# Patient Record
Sex: Female | Born: 1965 | Race: White | Hispanic: No | Marital: Married | Smoking: Never smoker
Health system: Southern US, Community
[De-identification: ages and names within clinical notes are randomized; demographics above are authoritative.]

## PROBLEM LIST (undated history)

## (undated) DIAGNOSIS — J309 Allergic rhinitis, unspecified: Secondary | ICD-10-CM

## (undated) DIAGNOSIS — E7800 Pure hypercholesterolemia, unspecified: Secondary | ICD-10-CM

## (undated) DIAGNOSIS — F419 Anxiety disorder, unspecified: Secondary | ICD-10-CM

## (undated) DIAGNOSIS — D72819 Decreased white blood cell count, unspecified: Secondary | ICD-10-CM

## (undated) DIAGNOSIS — E559 Vitamin D deficiency, unspecified: Secondary | ICD-10-CM

## (undated) HISTORY — DX: Pure hypercholesterolemia, unspecified: E78.00

## (undated) HISTORY — DX: Allergic rhinitis, unspecified: J30.9

## (undated) HISTORY — DX: Decreased white blood cell count, unspecified: D72.819

## (undated) HISTORY — DX: Vitamin D deficiency, unspecified: E55.9

## (undated) HISTORY — DX: Anxiety disorder, unspecified: F41.9

## (undated) HISTORY — PX: BREAST BIOPSY: SHX20

---

## 1998-09-01 ENCOUNTER — Other Ambulatory Visit: Admission: RE | Admit: 1998-09-01 | Discharge: 1998-09-01 | Payer: Self-pay | Admitting: Obstetrics & Gynecology

## 1999-09-08 ENCOUNTER — Other Ambulatory Visit: Admission: RE | Admit: 1999-09-08 | Discharge: 1999-09-08 | Payer: Self-pay | Admitting: Obstetrics and Gynecology

## 2000-09-24 ENCOUNTER — Other Ambulatory Visit: Admission: RE | Admit: 2000-09-24 | Discharge: 2000-09-24 | Payer: Self-pay | Admitting: Obstetrics and Gynecology

## 2001-10-31 ENCOUNTER — Other Ambulatory Visit: Admission: RE | Admit: 2001-10-31 | Discharge: 2001-10-31 | Payer: Self-pay | Admitting: Obstetrics and Gynecology

## 2002-11-03 ENCOUNTER — Other Ambulatory Visit: Admission: RE | Admit: 2002-11-03 | Discharge: 2002-11-03 | Payer: Self-pay | Admitting: Obstetrics and Gynecology

## 2003-01-02 ENCOUNTER — Ambulatory Visit (HOSPITAL_COMMUNITY): Admission: RE | Admit: 2003-01-02 | Discharge: 2003-01-02 | Payer: Self-pay | Admitting: Obstetrics and Gynecology

## 2003-01-02 ENCOUNTER — Encounter (INDEPENDENT_AMBULATORY_CARE_PROVIDER_SITE_OTHER): Payer: Self-pay

## 2004-01-05 ENCOUNTER — Other Ambulatory Visit: Admission: RE | Admit: 2004-01-05 | Discharge: 2004-01-05 | Payer: Self-pay | Admitting: Obstetrics and Gynecology

## 2005-03-15 ENCOUNTER — Other Ambulatory Visit: Admission: RE | Admit: 2005-03-15 | Discharge: 2005-03-15 | Payer: Self-pay | Admitting: Obstetrics and Gynecology

## 2006-06-25 ENCOUNTER — Encounter: Admission: RE | Admit: 2006-06-25 | Discharge: 2006-06-25 | Payer: Self-pay | Admitting: Family Medicine

## 2006-06-28 ENCOUNTER — Encounter: Admission: RE | Admit: 2006-06-28 | Discharge: 2006-06-28 | Payer: Self-pay | Admitting: Family Medicine

## 2006-10-17 ENCOUNTER — Other Ambulatory Visit: Admission: RE | Admit: 2006-10-17 | Discharge: 2006-10-17 | Payer: Self-pay | Admitting: Family Medicine

## 2007-01-28 ENCOUNTER — Encounter: Admission: RE | Admit: 2007-01-28 | Discharge: 2007-01-28 | Payer: Self-pay | Admitting: Family Medicine

## 2007-07-15 ENCOUNTER — Encounter: Admission: RE | Admit: 2007-07-15 | Discharge: 2007-07-15 | Payer: Self-pay | Admitting: Family Medicine

## 2007-11-01 ENCOUNTER — Other Ambulatory Visit: Admission: RE | Admit: 2007-11-01 | Discharge: 2007-11-01 | Payer: Self-pay | Admitting: Family Medicine

## 2008-02-18 ENCOUNTER — Encounter: Admission: RE | Admit: 2008-02-18 | Discharge: 2008-02-18 | Payer: Self-pay | Admitting: Family Medicine

## 2008-09-18 ENCOUNTER — Encounter: Admission: RE | Admit: 2008-09-18 | Discharge: 2008-09-18 | Payer: Self-pay | Admitting: Family Medicine

## 2009-01-28 ENCOUNTER — Other Ambulatory Visit: Admission: RE | Admit: 2009-01-28 | Discharge: 2009-01-28 | Payer: Self-pay | Admitting: Family Medicine

## 2009-10-27 ENCOUNTER — Encounter: Admission: RE | Admit: 2009-10-27 | Discharge: 2009-10-27 | Payer: Self-pay | Admitting: Family Medicine

## 2010-04-24 ENCOUNTER — Encounter: Payer: Self-pay | Admitting: Family Medicine

## 2010-08-19 NOTE — H&P (Signed)
   NAMELEJLA, MOESER                        ACCOUNT NO.:  [id number]   MEDICAL RECORD NO.:  [id number]                   PATIENT TYPE:  AMB   LOCATION:  SDC                                  FACILITY:  [facility]   PHYSICIAN:  Dineen Kid. Rana Snare, M.D.                 DATE OF BIRTH:  1965/06/02   DATE OF ADMISSION:  01/02/2003  DATE OF DISCHARGE:                                HISTORY & PHYSICAL   HISTORY OF PRESENT ILLNESS:  Beth Dunn is a 45 year old, G2, P2, with  worsening abnormal bleeding, mostly in the mid cycle.  Her husband has had a  vasectomy.  She has not been on birth control pill.  She thought this was  previously due to cervicitis that did not respond to antibiotics.  She  underwent a sonohysterogram for evaluation of the abnormal bleeding.  She  has a 1.1 cm x 9 mm polyp within the endometrial cavity coming from the  anterior surface.  She presents today for evaluation and removal of this.   PAST MEDICAL HISTORY:  Significant for anxiety disorder, currently on  Effexor.   PAST OBSTETRICAL HISTORY:  Significant for two vaginal deliveries.   MEDICATIONS:  Effexor 75 mg a day.   ALLERGIES:  She has no known drug allergies.   PHYSICAL EXAMINATION:  VITAL SIGNS:  Her blood pressure is 124/68.  HEART:  Regular rate and rhythm.  LUNGS:  Clear to auscultation bilaterally.  ABDOMEN:  Nondistended and nontender.  PELVIC:  The uterus is 3.59 x 3.3 x 4.58 cm with a 1.1 cm polypoid mass.   IMPRESSION AND PLAN:  Abnormal bleeding and endometrial mass consistent with  a polyp.  Recommend hysterectomy and D&C for removal of this.  The risks and  benefits have been discussed at length, which include, but are not limited  to risk of infection, bleeding, damage to uterus, tubes, ovaries, bowel and  bladder, risks associated with anesthesia, and risks associated with blood  transfusion.  She does give her informed consent.                                               Dineen Kid  Rana Snare, M.D.    DCL/MEDQ  D:  01/01/2003  T:  01/01/2003  Job:  [id number]

## 2010-08-19 NOTE — Op Note (Signed)
Beth Dunn, Beth Dunn                        ACCOUNT NO.:  [id number]   MEDICAL RECORD NO.:  [id number]                   PATIENT TYPE:  AMB   LOCATION:  SDC                                  FACILITY:  [facility]   PHYSICIAN:  Dineen Kid. Rana Snare, M.D.                 DATE OF BIRTH:  03/16/1966   DATE OF PROCEDURE:  01/02/2003  DATE OF DISCHARGE:                                 OPERATIVE REPORT   PREOPERATIVE DIAGNOSES:  1. Abnormal uterine bleeding.  2. Endometrial mass consistent with polyp.   POSTOPERATIVE DIAGNOSES:  1. Abnormal uterine bleeding.  2. Endometrial mass consistent with polyp.  3. Friable cervix.   PROCEDURE:  Hysteroscopy D&C.   SURGEON:  Dineen Kid. Rana Snare, M.D.   ANESTHESIA:  Monitored anesthetic care and paracervical block.   ESTIMATED BLOOD LOSS:  Less than 10 mL.   SORBITOL DEFICIT:  Minimal.   FINDINGS:  Small posterior wall endometrial thickening, question polypoid,  otherwise normal-appearing endometrial cavity and normal-appearing left and  right ostia.  She did have a very friable cervix which appeared to be  somewhat polypoid.   DESCRIPTION OF PROCEDURE:  After adequate analgesia, the patient placed in  the dorsal lithotomy position.  She was sterilely prepped and draped.  The  bladder was sterilely drained.  Graves speculum was placed.  Paracervical  block was placed with 1% Xylocaine with 1:100,000 epinephrine, 20 mL used.  Tenaculum was placed in the anterior lip of the cervix.  Uterus was sounded  to 8 cm.  The hysteroscope was inserted.  The above findings were noted.  The large endometrial polyp which was previously seen on sonohistogram was  not identified, however, there was small thickening on the posterior  endometrial wall.  Normal-appearing ostia.  There was also very friable  cervix which could have represented a polypoid mass coming from the anterior  surface.  Sharp curettage was performed, retrieving small fragments from the  posterior  endometrial wall, unable to remove the cervical polypoid area with  curettage.  Reexamination with the hysteroscope revealed no endometrial  pathology at this time.  A small amount of endometrial tissue was sent to  pathology.  Because of the friable cervix and polypoid appearing tissue,  Bovie cautery was used to cauterize the tissue on the external portion of  the cervix with good cauterization noted down to the base of the cervical  polypoid tissue.  The tenaculum was removed and the cervix was hemostatic.  Speculum was then removed and the patient was transferred to the recovery  room in stable condition.  Estimated blood loss during the procedure was  less than 10 mL.  Sorbitol deficit was minimal.    DISPOSITION:  The patient will be discharged to home and will follow up in  the office in two to three weeks.  Sent home with a routine instruction  sheet for D&C and told to return  for increased pain, fever or bleeding.                                               Dineen Kid Rana Snare, M.D.    DCL/MEDQ  D:  01/02/2003  T:  01/02/2003  Job:  [id number]

## 2010-09-20 ENCOUNTER — Other Ambulatory Visit: Payer: Self-pay | Admitting: Family Medicine

## 2010-09-20 DIAGNOSIS — Z1231 Encounter for screening mammogram for malignant neoplasm of breast: Secondary | ICD-10-CM

## 2010-11-10 ENCOUNTER — Ambulatory Visit
Admission: RE | Admit: 2010-11-10 | Discharge: 2010-11-10 | Disposition: A | Discharge status: Home / Self Care | Payer: BC Managed Care – PPO | Source: Ambulatory Visit | Attending: Family Medicine | Admitting: Family Medicine

## 2010-11-10 DIAGNOSIS — Z1231 Encounter for screening mammogram for malignant neoplasm of breast: Secondary | ICD-10-CM

## 2011-10-20 ENCOUNTER — Other Ambulatory Visit: Payer: Self-pay | Admitting: Family Medicine

## 2011-10-20 DIAGNOSIS — Z1231 Encounter for screening mammogram for malignant neoplasm of breast: Secondary | ICD-10-CM

## 2011-11-02 ENCOUNTER — Other Ambulatory Visit: Payer: Self-pay | Admitting: Family Medicine

## 2011-11-02 ENCOUNTER — Other Ambulatory Visit (HOSPITAL_COMMUNITY)
Admission: RE | Admit: 2011-11-02 | Discharge: 2011-11-02 | Disposition: A | Discharge status: Home / Self Care | Payer: BC Managed Care – PPO | Source: Ambulatory Visit | Attending: Family Medicine | Admitting: Family Medicine

## 2011-11-02 DIAGNOSIS — Z124 Encounter for screening for malignant neoplasm of cervix: Secondary | ICD-10-CM | POA: Insufficient documentation

## 2011-11-14 ENCOUNTER — Ambulatory Visit
Admission: RE | Admit: 2011-11-14 | Discharge: 2011-11-14 | Disposition: A | Discharge status: Home / Self Care | Payer: BC Managed Care – PPO | Source: Ambulatory Visit | Attending: Family Medicine | Admitting: Family Medicine

## 2011-11-14 DIAGNOSIS — Z1231 Encounter for screening mammogram for malignant neoplasm of breast: Secondary | ICD-10-CM

## 2011-11-16 ENCOUNTER — Other Ambulatory Visit: Payer: Self-pay | Admitting: Family Medicine

## 2011-11-16 DIAGNOSIS — R928 Other abnormal and inconclusive findings on diagnostic imaging of breast: Secondary | ICD-10-CM

## 2011-11-22 ENCOUNTER — Ambulatory Visit
Admission: RE | Admit: 2011-11-22 | Discharge: 2011-11-22 | Disposition: A | Discharge status: Home / Self Care | Payer: BC Managed Care – PPO | Source: Ambulatory Visit | Attending: Family Medicine | Admitting: Family Medicine

## 2011-11-22 DIAGNOSIS — R928 Other abnormal and inconclusive findings on diagnostic imaging of breast: Secondary | ICD-10-CM

## 2012-10-23 ENCOUNTER — Other Ambulatory Visit: Payer: Self-pay

## 2012-10-23 DIAGNOSIS — Z1231 Encounter for screening mammogram for malignant neoplasm of breast: Secondary | ICD-10-CM

## 2012-11-15 ENCOUNTER — Ambulatory Visit
Admission: RE | Admit: 2012-11-15 | Discharge: 2012-11-15 | Disposition: A | Discharge status: Home / Self Care | Payer: BC Managed Care – PPO | Source: Ambulatory Visit

## 2012-11-15 DIAGNOSIS — Z1231 Encounter for screening mammogram for malignant neoplasm of breast: Secondary | ICD-10-CM

## 2013-10-08 ENCOUNTER — Other Ambulatory Visit: Payer: Self-pay

## 2013-10-08 DIAGNOSIS — Z1231 Encounter for screening mammogram for malignant neoplasm of breast: Secondary | ICD-10-CM

## 2013-11-17 ENCOUNTER — Encounter (INDEPENDENT_AMBULATORY_CARE_PROVIDER_SITE_OTHER): Payer: Self-pay

## 2013-11-17 ENCOUNTER — Ambulatory Visit
Admission: RE | Admit: 2013-11-17 | Discharge: 2013-11-17 | Disposition: A | Discharge status: Home / Self Care | Payer: BC Managed Care – PPO | Source: Ambulatory Visit

## 2013-11-17 DIAGNOSIS — Z1231 Encounter for screening mammogram for malignant neoplasm of breast: Secondary | ICD-10-CM

## 2015-01-04 ENCOUNTER — Other Ambulatory Visit: Payer: Self-pay

## 2015-01-04 DIAGNOSIS — Z1231 Encounter for screening mammogram for malignant neoplasm of breast: Secondary | ICD-10-CM

## 2015-01-12 ENCOUNTER — Ambulatory Visit
Admission: RE | Admit: 2015-01-12 | Discharge: 2015-01-12 | Disposition: A | Discharge status: Home / Self Care | Payer: BC Managed Care – PPO | Source: Ambulatory Visit

## 2015-01-12 DIAGNOSIS — Z1231 Encounter for screening mammogram for malignant neoplasm of breast: Secondary | ICD-10-CM

## 2015-01-15 ENCOUNTER — Other Ambulatory Visit: Payer: Self-pay | Admitting: Family Medicine

## 2015-01-15 DIAGNOSIS — R928 Other abnormal and inconclusive findings on diagnostic imaging of breast: Secondary | ICD-10-CM

## 2015-01-22 ENCOUNTER — Ambulatory Visit
Admission: RE | Admit: 2015-01-22 | Discharge: 2015-01-22 | Disposition: A | Discharge status: Home / Self Care | Payer: BC Managed Care – PPO | Source: Ambulatory Visit | Attending: Family Medicine | Admitting: Family Medicine

## 2015-01-22 DIAGNOSIS — R928 Other abnormal and inconclusive findings on diagnostic imaging of breast: Secondary | ICD-10-CM

## 2015-04-02 ENCOUNTER — Other Ambulatory Visit: Payer: Self-pay | Admitting: Family Medicine

## 2015-04-02 DIAGNOSIS — R52 Pain, unspecified: Secondary | ICD-10-CM

## 2015-04-09 ENCOUNTER — Ambulatory Visit
Admission: RE | Admit: 2015-04-09 | Discharge: 2015-04-09 | Disposition: A | Discharge status: Home / Self Care | Payer: BC Managed Care – PPO | Source: Ambulatory Visit | Attending: Family Medicine | Admitting: Family Medicine

## 2015-04-09 DIAGNOSIS — R52 Pain, unspecified: Secondary | ICD-10-CM

## 2015-05-05 ENCOUNTER — Other Ambulatory Visit: Payer: Self-pay | Admitting: Family Medicine

## 2015-05-05 ENCOUNTER — Other Ambulatory Visit (HOSPITAL_COMMUNITY)
Admission: RE | Admit: 2015-05-05 | Discharge: 2015-05-05 | Disposition: A | Discharge status: Home / Self Care | Payer: BC Managed Care – PPO | Source: Ambulatory Visit | Attending: Family Medicine | Admitting: Family Medicine

## 2015-05-05 DIAGNOSIS — Z01411 Encounter for gynecological examination (general) (routine) with abnormal findings: Secondary | ICD-10-CM | POA: Insufficient documentation

## 2015-05-06 LAB — CYTOLOGY - PAP

## 2015-06-08 ENCOUNTER — Ambulatory Visit (INDEPENDENT_AMBULATORY_CARE_PROVIDER_SITE_OTHER): Payer: BC Managed Care – PPO | Admitting: Licensed Clinical Social Worker

## 2015-06-08 DIAGNOSIS — F419 Anxiety disorder, unspecified: Secondary | ICD-10-CM

## 2015-06-22 ENCOUNTER — Ambulatory Visit (INDEPENDENT_AMBULATORY_CARE_PROVIDER_SITE_OTHER): Payer: BC Managed Care – PPO | Admitting: Licensed Clinical Social Worker

## 2015-06-22 DIAGNOSIS — F419 Anxiety disorder, unspecified: Secondary | ICD-10-CM | POA: Diagnosis not present

## 2015-07-07 ENCOUNTER — Ambulatory Visit: Payer: BC Managed Care – PPO | Admitting: Licensed Clinical Social Worker

## 2015-07-13 ENCOUNTER — Ambulatory Visit (INDEPENDENT_AMBULATORY_CARE_PROVIDER_SITE_OTHER): Payer: BC Managed Care – PPO | Admitting: Licensed Clinical Social Worker

## 2015-07-13 DIAGNOSIS — F419 Anxiety disorder, unspecified: Secondary | ICD-10-CM

## 2015-07-28 ENCOUNTER — Ambulatory Visit (INDEPENDENT_AMBULATORY_CARE_PROVIDER_SITE_OTHER): Payer: BC Managed Care – PPO | Admitting: Licensed Clinical Social Worker

## 2015-07-28 DIAGNOSIS — F419 Anxiety disorder, unspecified: Secondary | ICD-10-CM | POA: Diagnosis not present

## 2015-09-30 ENCOUNTER — Ambulatory Visit (INDEPENDENT_AMBULATORY_CARE_PROVIDER_SITE_OTHER): Payer: BC Managed Care – PPO | Admitting: Licensed Clinical Social Worker

## 2015-09-30 DIAGNOSIS — F419 Anxiety disorder, unspecified: Secondary | ICD-10-CM | POA: Diagnosis not present

## 2015-11-09 ENCOUNTER — Other Ambulatory Visit: Payer: Self-pay | Admitting: Family Medicine

## 2015-11-09 DIAGNOSIS — N6001 Solitary cyst of right breast: Secondary | ICD-10-CM

## 2015-11-10 ENCOUNTER — Ambulatory Visit
Admission: RE | Admit: 2015-11-10 | Discharge: 2015-11-10 | Disposition: A | Discharge status: Home / Self Care | Payer: BC Managed Care – PPO | Source: Ambulatory Visit | Attending: Family Medicine | Admitting: Family Medicine

## 2015-11-10 ENCOUNTER — Other Ambulatory Visit: Payer: Self-pay | Admitting: Family Medicine

## 2015-11-10 DIAGNOSIS — R921 Mammographic calcification found on diagnostic imaging of breast: Secondary | ICD-10-CM

## 2015-11-10 DIAGNOSIS — N6001 Solitary cyst of right breast: Secondary | ICD-10-CM

## 2015-12-27 ENCOUNTER — Other Ambulatory Visit: Payer: Self-pay | Admitting: Family Medicine

## 2015-12-27 DIAGNOSIS — Z1231 Encounter for screening mammogram for malignant neoplasm of breast: Secondary | ICD-10-CM

## 2016-01-17 ENCOUNTER — Ambulatory Visit
Admission: RE | Admit: 2016-01-17 | Discharge: 2016-01-17 | Disposition: A | Discharge status: Home / Self Care | Payer: BC Managed Care – PPO | Source: Ambulatory Visit | Attending: Family Medicine | Admitting: Family Medicine

## 2016-01-17 DIAGNOSIS — Z1231 Encounter for screening mammogram for malignant neoplasm of breast: Secondary | ICD-10-CM

## 2016-02-15 ENCOUNTER — Ambulatory Visit (INDEPENDENT_AMBULATORY_CARE_PROVIDER_SITE_OTHER): Payer: BC Managed Care – PPO | Admitting: Licensed Clinical Social Worker

## 2016-02-15 DIAGNOSIS — F419 Anxiety disorder, unspecified: Secondary | ICD-10-CM

## 2016-03-08 ENCOUNTER — Ambulatory Visit: Payer: BC Managed Care – PPO | Admitting: Licensed Clinical Social Worker

## 2016-09-14 ENCOUNTER — Ambulatory Visit (INDEPENDENT_AMBULATORY_CARE_PROVIDER_SITE_OTHER): Payer: BC Managed Care – PPO | Admitting: Licensed Clinical Social Worker

## 2016-09-14 DIAGNOSIS — F419 Anxiety disorder, unspecified: Secondary | ICD-10-CM | POA: Diagnosis not present

## 2016-12-20 ENCOUNTER — Other Ambulatory Visit: Payer: Self-pay | Admitting: Family Medicine

## 2016-12-20 DIAGNOSIS — Z1231 Encounter for screening mammogram for malignant neoplasm of breast: Secondary | ICD-10-CM

## 2017-01-18 ENCOUNTER — Encounter: Payer: Self-pay | Admitting: Radiology

## 2017-01-18 ENCOUNTER — Ambulatory Visit
Admission: RE | Admit: 2017-01-18 | Discharge: 2017-01-18 | Disposition: A | Discharge status: Home / Self Care | Payer: BC Managed Care – PPO | Source: Ambulatory Visit | Attending: Family Medicine | Admitting: Family Medicine

## 2017-01-18 DIAGNOSIS — Z1231 Encounter for screening mammogram for malignant neoplasm of breast: Secondary | ICD-10-CM

## 2018-03-08 ENCOUNTER — Other Ambulatory Visit: Payer: Self-pay | Admitting: Family Medicine

## 2018-03-08 DIAGNOSIS — Z1231 Encounter for screening mammogram for malignant neoplasm of breast: Secondary | ICD-10-CM

## 2018-04-16 ENCOUNTER — Ambulatory Visit
Admission: RE | Admit: 2018-04-16 | Discharge: 2018-04-16 | Disposition: A | Discharge status: Home / Self Care | Payer: BC Managed Care – PPO | Source: Ambulatory Visit | Attending: Family Medicine | Admitting: Family Medicine

## 2018-04-16 DIAGNOSIS — Z1231 Encounter for screening mammogram for malignant neoplasm of breast: Secondary | ICD-10-CM

## 2018-04-18 ENCOUNTER — Other Ambulatory Visit: Payer: Self-pay | Admitting: *Deleted

## 2018-04-18 DIAGNOSIS — R928 Other abnormal and inconclusive findings on diagnostic imaging of breast: Secondary | ICD-10-CM

## 2018-04-25 ENCOUNTER — Other Ambulatory Visit: Payer: Self-pay | Admitting: Family Medicine

## 2018-04-25 DIAGNOSIS — R928 Other abnormal and inconclusive findings on diagnostic imaging of breast: Secondary | ICD-10-CM

## 2018-04-26 ENCOUNTER — Ambulatory Visit
Admission: RE | Admit: 2018-04-26 | Discharge: 2018-04-26 | Disposition: A | Discharge status: Home / Self Care | Payer: BC Managed Care – PPO | Source: Ambulatory Visit | Attending: *Deleted | Admitting: *Deleted

## 2018-04-26 DIAGNOSIS — R928 Other abnormal and inconclusive findings on diagnostic imaging of breast: Secondary | ICD-10-CM

## 2018-08-14 ENCOUNTER — Ambulatory Visit (INDEPENDENT_AMBULATORY_CARE_PROVIDER_SITE_OTHER): Payer: BC Managed Care – PPO | Admitting: Licensed Clinical Social Worker

## 2018-08-14 DIAGNOSIS — F419 Anxiety disorder, unspecified: Secondary | ICD-10-CM

## 2018-09-11 ENCOUNTER — Ambulatory Visit: Payer: BC Managed Care – PPO | Admitting: Licensed Clinical Social Worker

## 2019-02-18 ENCOUNTER — Other Ambulatory Visit (HOSPITAL_COMMUNITY)
Admission: RE | Admit: 2019-02-18 | Discharge: 2019-02-18 | Disposition: A | Discharge status: Home / Self Care | Payer: BC Managed Care – PPO | Source: Ambulatory Visit | Attending: Family Medicine | Admitting: Family Medicine

## 2019-02-18 ENCOUNTER — Other Ambulatory Visit: Payer: Self-pay | Admitting: Family Medicine

## 2019-02-18 DIAGNOSIS — Z01411 Encounter for gynecological examination (general) (routine) with abnormal findings: Secondary | ICD-10-CM | POA: Insufficient documentation

## 2019-02-20 LAB — CYTOLOGY - PAP
Comment: NEGATIVE
Diagnosis: NEGATIVE
High risk HPV: NEGATIVE

## 2019-04-02 ENCOUNTER — Other Ambulatory Visit: Payer: Self-pay | Admitting: Family Medicine

## 2019-04-02 DIAGNOSIS — Z1231 Encounter for screening mammogram for malignant neoplasm of breast: Secondary | ICD-10-CM

## 2019-05-16 ENCOUNTER — Ambulatory Visit: Payer: BC Managed Care – PPO

## 2019-05-19 ENCOUNTER — Ambulatory Visit: Payer: BC Managed Care – PPO

## 2019-06-23 ENCOUNTER — Other Ambulatory Visit: Payer: Self-pay | Admitting: Family Medicine

## 2019-06-23 DIAGNOSIS — Z1231 Encounter for screening mammogram for malignant neoplasm of breast: Secondary | ICD-10-CM

## 2019-06-30 ENCOUNTER — Other Ambulatory Visit: Payer: Self-pay

## 2019-06-30 ENCOUNTER — Ambulatory Visit
Admission: RE | Admit: 2019-06-30 | Discharge: 2019-06-30 | Disposition: A | Discharge status: Home / Self Care | Payer: BC Managed Care – PPO | Source: Ambulatory Visit

## 2019-06-30 DIAGNOSIS — Z1231 Encounter for screening mammogram for malignant neoplasm of breast: Secondary | ICD-10-CM

## 2020-06-03 ENCOUNTER — Other Ambulatory Visit: Payer: Self-pay | Admitting: Family Medicine

## 2020-06-03 DIAGNOSIS — Z1231 Encounter for screening mammogram for malignant neoplasm of breast: Secondary | ICD-10-CM

## 2020-07-22 ENCOUNTER — Other Ambulatory Visit: Payer: Self-pay

## 2020-07-22 ENCOUNTER — Ambulatory Visit
Admission: RE | Admit: 2020-07-22 | Discharge: 2020-07-22 | Disposition: A | Discharge status: Home / Self Care | Payer: BC Managed Care – PPO | Source: Ambulatory Visit | Attending: Family Medicine | Admitting: Family Medicine

## 2020-07-22 DIAGNOSIS — Z1231 Encounter for screening mammogram for malignant neoplasm of breast: Secondary | ICD-10-CM

## 2021-07-06 ENCOUNTER — Other Ambulatory Visit: Payer: Self-pay | Admitting: Family Medicine

## 2021-07-06 DIAGNOSIS — Z1231 Encounter for screening mammogram for malignant neoplasm of breast: Secondary | ICD-10-CM

## 2021-07-27 ENCOUNTER — Ambulatory Visit: Payer: BC Managed Care – PPO

## 2021-09-06 ENCOUNTER — Ambulatory Visit
Admission: RE | Admit: 2021-09-06 | Discharge: 2021-09-06 | Disposition: A | Discharge status: Home / Self Care | Payer: BC Managed Care – PPO | Source: Ambulatory Visit | Attending: Family Medicine | Admitting: Family Medicine

## 2021-09-06 DIAGNOSIS — Z1231 Encounter for screening mammogram for malignant neoplasm of breast: Secondary | ICD-10-CM

## 2022-04-20 ENCOUNTER — Encounter: Payer: Self-pay | Admitting: Physical Therapy

## 2022-04-20 ENCOUNTER — Other Ambulatory Visit: Payer: Self-pay

## 2022-04-20 ENCOUNTER — Ambulatory Visit: Payer: BC Managed Care – PPO | Attending: Family Medicine | Admitting: Physical Therapy

## 2022-04-20 DIAGNOSIS — M62838 Other muscle spasm: Secondary | ICD-10-CM | POA: Insufficient documentation

## 2022-04-20 DIAGNOSIS — R279 Unspecified lack of coordination: Secondary | ICD-10-CM | POA: Diagnosis not present

## 2022-04-20 NOTE — Therapy (Signed)
OUTPATIENT PHYSICAL THERAPY FEMALE PELVIC EVALUATION   Patient Name: Beth Dunn MRN: [id number] DOB:1965-05-08, 57 y.o., female Today's Date: 04/20/2022  END OF SESSION:  PT End of Session - 04/20/22 1426     Visit Number 1    Date for PT Re-Evaluation 07/13/22    Authorization Type [employer]    PT Start Time 1149    PT Stop Time 1230    PT Time Calculation (min) 41 min    Activity Tolerance Patient tolerated treatment well    Behavior During Therapy [facility] for tasks assessed/performed             History reviewed. No pertinent past medical history. Past Surgical History:  Procedure Laterality Date   BREAST BIOPSY Right    [place]   There are no problems to display for this patient.   PCP: Glenis Smoker, MD   REFERRING PROVIDER: Glenis Smoker, MD   REFERRING DIAG: R35.0 (ICD-10-CM) - Frequency of micturition   THERAPY DIAG:  Unspecified lack of coordination  Other muscle spasm  Rationale for Evaluation and Treatment: Rehabilitation  ONSET DATE: month ago  SUBJECTIVE:                                                                                                                                                                                           SUBJECTIVE STATEMENT: I feel like I am not emptying and having to double void.  I have to go frequently and it is the worst in the morning.  Very small amount of leakage in general.  Coughing sometimes causes leakage.  Full bladder can cause along with something like a cough, but it is a very small amount.  I am going to void every 2 hours other than the morning being worse.  Sometimes in the morning I feel I have to push to empty the bladder Fluid intake: Yes: water but not a lot, mostly coffee, sometimes colas or alcohol    PAIN:  Are you having pain? No  PRECAUTIONS: None  WEIGHT BEARING RESTRICTIONS: No  FALLS:  Has patient fallen in last 6 months? No  LIVING ENVIRONMENT: Lives with: lives  with their spouse Lives in: House/apartment   OCCUPATION: teaching - up and down  PLOF: Independent  PATIENT GOALS: not having to pee as often  PERTINENT HISTORY:  2 vaginal deliveries Sexual abuse: No  BOWEL MOVEMENT: Pain with bowel movement: No Type of bowel movement:Type (Bristol Stool Scale) normal, Frequency normal, and Strain No Fully empty rectum: Yes:   Leakage: No   URINATION: Pain with urination: No Fully empty bladder: No Stream: Weak Urgency: No  Frequency: in the morning every 1 hour; nocturia 1 sometimes Leakage: Coughing and when bladder is full Pads: No  INTERCOURSE: Pain with intercourse: Initial Penetration, During Penetration, and After Intercourse Ability to have vaginal penetration:  No haven't in a while  Marinoff Scale: 3/3  PREGNANCY: Vaginal deliveries 2 Tearing Yes: some with the first   PROLAPSE:    OBJECTIVE:   DIAGNOSTIC FINDINGS:    PATIENT SURVEYS:    PFIQ-7   COGNITION: Overall cognitive status: Within functional limits for tasks assessed     SENSATION: Light touch: Appears intact Proprioception: Appears intact  MUSCLE LENGTH: Hamstrings: Right 90% deg; Left 90% deg Thomas test:  LUMBAR SPECIAL TESTS:    FUNCTIONAL TESTS:    GAIT:  Comments: WFL   POSTURE: anterior pelvic tilt  PELVIC ALIGNMENT:  LUMBARAROM/PROM:  A/PROM A/PROM  eval  Flexion   Extension   Right lateral flexion   Left lateral flexion   Right rotation   Left rotation    (Blank rows = not tested)  LOWER EXTREMITY ROM:  Passive ROM Right eval Left eval  Hip flexion    Hip extension    Hip abduction    Hip adduction    Hip internal rotation    Hip external rotation    Knee flexion    Knee extension    Ankle dorsiflexion    Ankle plantarflexion    Ankle inversion    Ankle eversion     (Blank rows = not tested)  LOWER EXTREMITY MMT: MMT 0-5 scale MMT Right eval Left eval  Hip flexion 5 5  Hip extension 4/5  4/5  Hip abduction 5 5  Hip adduction 5 5  Hip internal rotation 5 5  Hip external rotation 5 5  Knee flexion    Knee extension    Ankle dorsiflexion    Ankle plantarflexion    Ankle inversion    Ankle eversion     PALPATION:   General  tight lumbar and thoracic paraspinals                External Perineal Exam normal appearance, dryness                             Internal Pelvic Floor tender to palpation of obturator Rt>Lt and very tight levators Rt side, but high tone bil, hard time relaxing after tightening the pelvic floor and holds breath while doing a kegel  Patient confirms identification and approves PT to assess internal pelvic floor and treatment Yes  PELVIC MMT:   MMT eval  Vaginal 3+/5 for 4 reps, 10 sec hold   Internal Anal Sphincter   External Anal Sphincter   Puborectalis   Diastasis Recti   (Blank rows = not tested)        TONE: High tone Rt>Lt  PROLAPSE: No   TODAY'S TREATMENT:  DATE: 04/20/22             EVAL and initial self care activities Self care: urge, toileting, moisturizing techniques   PATIENT EDUCATION:  Education details: urge, toileting, moisturizing handouts Person educated: Patient Education method: Explanation, Demonstration, Verbal cues, and Handouts Education comprehension: verbalized understanding  HOME EXERCISE PROGRAM: Not given  ASSESSMENT:  CLINICAL IMPRESSION: Patient is a 57 y.o. female who was seen today for physical therapy evaluation and treatment for urinary frequency. Pt is likely having moderate bladder irritation due to low hydration and muscle tension.  Pt has pelvic floor tension and impaired coordination as she breaths in and holds breath in order to perform kegel. Pt does have strong 3+/5 pelvic floor strength and holding for at least 1 sec.  Pt has spasm of levators and obturators  Rt>Lt.  Pt will benefit from skilled PT to address all areas of impairment for improved voiding frequency and ease of bladder emptying.  OBJECTIVE IMPAIRMENTS: decreased coordination, decreased endurance, decreased ROM, decreased strength, increased muscle spasms, impaired flexibility, and impaired tone.   ACTIVITY LIMITATIONS: continence and toileting  PARTICIPATION LIMITATIONS: interpersonal relationship and community activity  PERSONAL FACTORS: 1 comorbidity: vaginal deliveries  are also affecting patient's functional outcome.   REHAB POTENTIAL: Excellent  CLINICAL DECISION MAKING: Stable/uncomplicated  EVALUATION COMPLEXITY: Low   GOALS: Goals reviewed with patient? Yes  SHORT TERM GOALS: Target date: 05/18/22  Ind with toileting and moisturizing techniques Baseline: Goal status: INITIAL  2.  Ind with initial HEP for stretching and bulging pelvic floor Baseline:  Goal status: INITIAL    LONG TERM GOALS: Target date: 07/13/2022   Pt will be able to hold pelvic floor contraction for 10 seconds or more without breath holding Baseline:  Goal status: INITIAL  2.  Pt will be independent with advanced HEP to maintain improvements made throughout therapy  Baseline:  Goal status: INITIAL  3.  Pt will report she can make it at least 2.5 between voids Baseline:  Goal status: INITIAL  4.  Pt will be able to empty bladder one time without pushing during her morning routine Baseline:  Goal status: INITIAL   PLAN:  PT FREQUENCY: 1x/week  PT DURATION: 12 weeks  PLANNED INTERVENTIONS: Therapeutic exercises, Therapeutic activity, Neuromuscular re-education, Balance training, Gait training, Patient/Family education, Self Care, Joint mobilization, Dry Needling, Electrical stimulation, Cryotherapy, Moist heat, Taping, Biofeedback, Manual therapy, and Re-evaluation  PLAN FOR NEXT SESSION: f/u on toileting and urgency, f/u on drinking more water and moisturizing, discuss  starting with large glass of water in the morning   [employer], PT 04/20/2022, 2:37 PM

## 2022-05-29 ENCOUNTER — Ambulatory Visit: Payer: BC Managed Care – PPO | Attending: Family Medicine | Admitting: Physical Therapy

## 2022-05-29 DIAGNOSIS — R279 Unspecified lack of coordination: Secondary | ICD-10-CM | POA: Diagnosis present

## 2022-05-29 DIAGNOSIS — M62838 Other muscle spasm: Secondary | ICD-10-CM | POA: Diagnosis present

## 2022-05-29 NOTE — Therapy (Signed)
OUTPATIENT PHYSICAL THERAPY FEMALE PELVIC TREATMENT   Patient Name: Beth Dunn MRN: [id number] DOB:[birth year], 57 y.o., female Today's Date: 05/29/2022  END OF SESSION:    No past medical history on file. Past Surgical History:  Procedure Laterality Date   BREAST BIOPSY Right    [place]   There are no problems to display for this patient.   PCP: Glenis Smoker, MD   REFERRING PROVIDER: Glenis Smoker, MD   REFERRING DIAG: R35.0 (ICD-10-CM) - Frequency of micturition   THERAPY DIAG:  No diagnosis found.  Rationale for Evaluation and Treatment: Rehabilitation  ONSET DATE: month ago  SUBJECTIVE:                                                                                                                                                                                           SUBJECTIVE STATEMENT: I have been using moisturizer and not going as often.  Not going at night as much.  As far as the muscles I feel 30% better.  Going twice in the morning is the main issue I have with the frequency.  Fluid intake: Yes: water but not a lot, mostly coffee, sometimes colas or alcohol    PAIN:  Are you having pain? No  PRECAUTIONS: None  WEIGHT BEARING RESTRICTIONS: No  FALLS:  Has patient fallen in last 6 months? No  LIVING ENVIRONMENT: Lives with: lives with their spouse Lives in: House/apartment   OCCUPATION: teaching - up and down  PLOF: Independent  PATIENT GOALS: not having to pee as often  PERTINENT HISTORY:  2 vaginal deliveries Sexual abuse: No  BOWEL MOVEMENT: Pain with bowel movement: No Type of bowel movement:Type (Bristol Stool Scale) normal, Frequency normal, and Strain No Fully empty rectum: Yes:   Leakage: No   URINATION: Pain with urination: No Fully empty bladder: No Stream: Weak Urgency: No Frequency: in the morning every 1 hour; nocturia 1 sometimes Leakage: Coughing and when bladder is full Pads:  No  INTERCOURSE: Pain with intercourse: Initial Penetration, During Penetration, and After Intercourse Ability to have vaginal penetration:  No haven't in a while  Marinoff Scale: 3/3  PREGNANCY: Vaginal deliveries 2 Tearing Yes: some with the first   PROLAPSE:    OBJECTIVE:   DIAGNOSTIC FINDINGS:    PATIENT SURVEYS:    PFIQ-7   COGNITION: Overall cognitive status: Within functional limits for tasks assessed     SENSATION: Light touch: Appears intact Proprioception: Appears intact  MUSCLE LENGTH: Hamstrings: Right 90% deg; Left 90% deg Thomas test:  LUMBAR SPECIAL TESTS:    FUNCTIONAL TESTS:    GAIT:  Comments: [facility]  POSTURE: anterior pelvic tilt  PELVIC ALIGNMENT:  LUMBARAROM/PROM:  A/PROM A/PROM  eval  Flexion   Extension   Right lateral flexion   Left lateral flexion   Right rotation   Left rotation    (Blank rows = not tested)  LOWER EXTREMITY ROM:  Passive ROM Right eval Left eval  Hip flexion    Hip extension    Hip abduction    Hip adduction    Hip internal rotation    Hip external rotation    Knee flexion    Knee extension    Ankle dorsiflexion    Ankle plantarflexion    Ankle inversion    Ankle eversion     (Blank rows = not tested)  LOWER EXTREMITY MMT: MMT 0-5 scale MMT Right eval Left eval  Hip flexion 5 5  Hip extension 4/5 4/5  Hip abduction 5 5  Hip adduction 5 5  Hip internal rotation 5 5  Hip external rotation 5 5  Knee flexion    Knee extension    Ankle dorsiflexion    Ankle plantarflexion    Ankle inversion    Ankle eversion     PALPATION:   General  tight lumbar and thoracic paraspinals                External Perineal Exam normal appearance, dryness                             Internal Pelvic Floor tender to palpation of obturator Rt>Lt and very tight levators Rt side, but high tone bil, hard time relaxing after tightening the pelvic floor and holds breath while doing a kegel  Patient  confirms identification and approves PT to assess internal pelvic floor and treatment Yes  PELVIC MMT:   MMT eval  Vaginal 3+/5 for 4 reps, 10 sec hold   Internal Anal Sphincter   External Anal Sphincter   Puborectalis   Diastasis Recti   (Blank rows = not tested)        TONE: High tone Rt>Lt  PROLAPSE: No   TODAY'S TREATMENT:                                                                                                                              DATE: 05/29/22              NMRE: - Ball squeeze with Kegel  - 3 x daily - 7 x weekly - 1 sets - 10 reps - 3 sec hold - Hooklying Small March  - 1 x daily - 7 x weekly - 2 sets - 10 reps Quadruped - arm reach and kegel with breathing - hard to do everything at the same time  Exercises - Supine Double Knee to Chest  - 1 x daily - 7 x weekly - 1 sets - 3 reps - 30 sec hold - Happy Baby with  Pelvic Floor Lengthening  - 1 x daily - 7 x weekly - 1 sets - 3 reps - 30 hold - Supine Butterfly Groin Stretch  - 1 x daily - 7 x weekly - 1 sets - 3 reps - 30 sec hold - Sidelying Thoracic Lumbar Rotation  - 1 x daily - 7 x weekly - 1 sets - 5 reps - 10 sec hold   PATIENT EDUCATION:  Education details: urge, toileting, moisturizing handouts Person educated: Patient Education method: Explanation, Demonstration, Verbal cues, and Handouts Education comprehension: verbalized understanding  HOME EXERCISE PROGRAM: Access Code: [email or web address] URL: [email or web address] Date: 05/29/2022 Prepared by: Jari Favre  Exercises - Ball squeeze with Kegel  - 3 x daily - 7 x weekly - 1 sets - 10 reps - 3 sec hold - Hooklying Small March  - 1 x daily - 7 x weekly - 2 sets - 10 reps - Supine Double Knee to Chest  - 1 x daily - 7 x weekly - 1 sets - 3 reps - 30 sec hold - Happy Baby with Pelvic Floor Lengthening  - 1 x daily - 7 x weekly - 1 sets - 3 reps - 30 hold - Supine Butterfly Groin Stretch  - 1 x daily - 7 x weekly - 1 sets - 3  reps - 30 sec hold - Sidelying Thoracic Lumbar Rotation  - 1 x daily - 7 x weekly - 1 sets - 5 reps - 10 sec hold  ASSESSMENT:  CLINICAL IMPRESSION: Patient is doing much better with more water and not going as frequently as well as using moisturizer for improved tissue health.  Pt was able to begin working on coordination of pelvic floor and breathing.  Pt did well and exercises added as seen above. Pt will benefit from skilled PT to progress core and postural strength with coordination of pelvic floor.  OBJECTIVE IMPAIRMENTS: decreased coordination, decreased endurance, decreased ROM, decreased strength, increased muscle spasms, impaired flexibility, and impaired tone.   ACTIVITY LIMITATIONS: continence and toileting  PARTICIPATION LIMITATIONS: interpersonal relationship and community activity  PERSONAL FACTORS: 1 comorbidity: vaginal deliveries  are also affecting patient's functional outcome.   REHAB POTENTIAL: Excellent  CLINICAL DECISION MAKING: Stable/uncomplicated  EVALUATION COMPLEXITY: Low   GOALS: Goals reviewed with patient? Yes  SHORT TERM GOALS: Target date: 05/18/22  Ind with toileting and moisturizing techniques Baseline: Goal status: MET  2.  Ind with initial HEP for stretching and bulging pelvic floor Baseline:  Goal status: INITIAL    LONG TERM GOALS: Target date: 07/13/2022   Pt will be able to hold pelvic floor contraction for 10 seconds or more without breath holding Baseline:  Goal status: INITIAL  2.  Pt will be independent with advanced HEP to maintain improvements made throughout therapy  Baseline:  Goal status: INITIAL  3.  Pt will report she can make it at least 2.5 between voids Baseline: goes at least 2 hour Goal status: INITIAL  4.  Pt will be able to empty bladder one time without pushing during her morning routine Baseline: 2 x in the morning and sometimes pushing Goal status: INITIAL   PLAN:  PT FREQUENCY: 1x/week  PT  DURATION: 12 weeks  PLANNED INTERVENTIONS: Therapeutic exercises, Therapeutic activity, Neuromuscular re-education, Balance training, Gait training, Patient/Family education, Self Care, Joint mobilization, Dry Needling, Electrical stimulation, Cryotherapy, Moist heat, Taping, Biofeedback, Manual therapy, and Re-evaluation  PLAN FOR NEXT SESSION: f/u on HEP, try progressing core and pelvic floor to functional movements, exhale  with exertion   Jule Ser, PT 05/29/2022, 8:38 AM

## 2022-06-05 ENCOUNTER — Encounter: Payer: Self-pay | Admitting: Physical Therapy

## 2022-06-05 ENCOUNTER — Ambulatory Visit: Payer: BC Managed Care – PPO | Attending: Family Medicine | Admitting: Physical Therapy

## 2022-06-05 DIAGNOSIS — R279 Unspecified lack of coordination: Secondary | ICD-10-CM | POA: Insufficient documentation

## 2022-06-05 DIAGNOSIS — M62838 Other muscle spasm: Secondary | ICD-10-CM | POA: Diagnosis present

## 2022-06-05 NOTE — Therapy (Signed)
OUTPATIENT PHYSICAL THERAPY FEMALE PELVIC TREATMENT   Patient Name: Beth Dunn MRN: [id number] DOB:1965/09/22, 57 y.o., female Today's Date: 06/05/2022  END OF SESSION:  PT End of Session - 06/05/22 1659     Visit Number 3    Date for PT Re-Evaluation 07/13/22    Authorization Type BCBS    PT Start Time 1615    PT Stop Time 1659    PT Time Calculation (min) 44 min    Activity Tolerance Patient tolerated treatment well    Behavior During Therapy [facility] for tasks assessed/performed              History reviewed. No pertinent past medical history. Past Surgical History:  Procedure Laterality Date   BREAST BIOPSY Right    [place]   There are no problems to display for this patient.   PCP: Glenis Smoker, MD   REFERRING PROVIDER: Glenis Smoker, MD   REFERRING DIAG: R35.0 (ICD-10-CM) - Frequency of micturition   THERAPY DIAG:  Unspecified lack of coordination  Other muscle spasm  Rationale for Evaluation and Treatment: Rehabilitation  ONSET DATE: month ago  SUBJECTIVE:                                                                                                                                                                                           SUBJECTIVE STATEMENT: I have been using vitamin e and the biggest change is not going at night any more.  I don't have to go twice in the morning but the stream is not strong even when my bladder is full in the morning and the urgency doesn't see to match how much I go. Fluid intake: Yes: water but not a lot, mostly coffee, sometimes colas or alcohol    PAIN:  Are you having pain? No  PRECAUTIONS: None  WEIGHT BEARING RESTRICTIONS: No  FALLS:  Has patient fallen in last 6 months? No  LIVING ENVIRONMENT: Lives with: lives with their spouse Lives in: House/apartment   OCCUPATION: teaching - up and down  PLOF: Independent  PATIENT GOALS: not having to pee as often  PERTINENT HISTORY:   2 vaginal deliveries Sexual abuse: No  BOWEL MOVEMENT: Pain with bowel movement: No Type of bowel movement:Type (Bristol Stool Scale) normal, Frequency normal, and Strain No Fully empty rectum: Yes:   Leakage: No   URINATION: Pain with urination: No Fully empty bladder: No Stream: Weak Urgency: No Frequency: in the morning every 1 hour; nocturia 1 sometimes Leakage: Coughing and when bladder is full Pads: No  INTERCOURSE: Pain with intercourse: Initial Penetration, During Penetration, and After Intercourse Ability to  have vaginal penetration:  No haven't in a while  Marinoff Scale: 3/3  PREGNANCY: Vaginal deliveries 2 Tearing Yes: some with the first   PROLAPSE:    OBJECTIVE:   DIAGNOSTIC FINDINGS:    PATIENT SURVEYS:    PFIQ-7   COGNITION: Overall cognitive status: Within functional limits for tasks assessed     SENSATION: Light touch: Appears intact Proprioception: Appears intact  MUSCLE LENGTH: Hamstrings: Right 90% deg; Left 90% deg Thomas test:  LUMBAR SPECIAL TESTS:    FUNCTIONAL TESTS:    GAIT:  Comments: WFL   POSTURE: anterior pelvic tilt  PELVIC ALIGNMENT:  LUMBARAROM/PROM:  A/PROM A/PROM  eval  Flexion   Extension   Right lateral flexion   Left lateral flexion   Right rotation   Left rotation    (Blank rows = not tested)  LOWER EXTREMITY ROM:  Passive ROM Right eval Left eval  Hip flexion    Hip extension    Hip abduction    Hip adduction    Hip internal rotation    Hip external rotation    Knee flexion    Knee extension    Ankle dorsiflexion    Ankle plantarflexion    Ankle inversion    Ankle eversion     (Blank rows = not tested)  LOWER EXTREMITY MMT: MMT 0-5 scale MMT Right eval Left eval  Hip flexion 5 5  Hip extension 4/5 4/5  Hip abduction 5 5  Hip adduction 5 5  Hip internal rotation 5 5  Hip external rotation 5 5  Knee flexion    Knee extension    Ankle dorsiflexion    Ankle  plantarflexion    Ankle inversion    Ankle eversion     PALPATION:   General  tight lumbar and thoracic paraspinals                External Perineal Exam normal appearance, dryness                             Internal Pelvic Floor tender to palpation of obturator Rt>Lt and very tight levators Rt side, but high tone bil, hard time relaxing after tightening the pelvic floor and holds breath while doing a kegel  Patient confirms identification and approves PT to assess internal pelvic floor and treatment Yes  PELVIC MMT:   MMT eval  Vaginal 3+/5 for 4 reps, 10 sec hold   Internal Anal Sphincter   External Anal Sphincter   Puborectalis   Diastasis Recti   (Blank rows = not tested)        TONE: High tone Rt>Lt  PROLAPSE: No   TODAY'S TREATMENT:                                                                                                                              DATE: 06/05/22  NMRE: Breathing and bulging pelvic floor Qped - puppy pose, circles, rocking with hip rotation Sit to stand Dead lift  Exercise: Thoracic Rotation with resistance Thoracic rotation at the wall Sidelying rotation Supine on wedge - SKTC, DKTC, butterfly Manual: Thoracic and lumbar paraspinals   PATIENT EDUCATION:  Education details: urge, toileting, moisturizing handouts Access Code: [email or web address] Person educated: Patient Education method: Explanation, Demonstration, Tactile cues, Verbal cues, and Handouts Education comprehension: verbalized understanding and returned demonstration  HOME EXERCISE PROGRAM: Access Code: [email or web address] URL: [email or web address] Date: 06/05/2022 Prepared by: Jari Favre  Exercises - Ball squeeze with Kegel  - 3 x daily - 7 x weekly - 1 sets - 10 reps - 3 sec hold - Hooklying Small March  - 1 x daily - 7 x weekly - 2 sets - 10 reps - Supine Double Knee to Chest  - 1 x daily - 7 x weekly - 1 sets - 3 reps - 30 sec hold - Happy Baby  with Pelvic Floor Lengthening  - 1 x daily - 7 x weekly - 1 sets - 3 reps - 30 hold - Supine Butterfly Groin Stretch  - 1 x daily - 7 x weekly - 1 sets - 3 reps - 30 sec hold - Sidelying Thoracic Lumbar Rotation  - 1 x daily - 7 x weekly - 1 sets - 5 reps - 10 sec hold - Sit to Stand with Pelvic Floor Contraction  - 3 x daily - 7 x weekly - 1 sets - 10 reps - Standing with Forearms Thoracic Rotation  - 1 x daily - 7 x weekly - 3 sets - 10 reps - Thoracic Extension with Noodle/Towel  - 1 x daily - 7 x weekly - 3 sets - 10 reps - Quadruped Thoracic Spine Extension  - 1 x daily - 7 x weekly - 3 sets - 10 reps  ASSESSMENT:  CLINICAL IMPRESSION: Pt is no longer waking at night to pee.  Pt is able to hold the bladder longer and overall doing much better.  Pt is still having urgency with bladder not feeling as full and is having a weaker stream that normal.  Today's session focused more on ways to stretch and relax.  Did some eccentric strengthening with sit to stand and slow lowering to chair and dead lifts.  Pt felt better with more thoracic mobility Updates made to HEP to work on lengthening pelvic floor muscles.  Continue to work on functional goals  OBJECTIVE IMPAIRMENTS: decreased coordination, decreased endurance, decreased ROM, decreased strength, increased muscle spasms, impaired flexibility, and impaired tone.   ACTIVITY LIMITATIONS: continence and toileting  PARTICIPATION LIMITATIONS: interpersonal relationship and community activity  PERSONAL FACTORS: 1 comorbidity: vaginal deliveries  are also affecting patient's functional outcome.   REHAB POTENTIAL: Excellent  CLINICAL DECISION MAKING: Stable/uncomplicated  EVALUATION COMPLEXITY: Low   GOALS: Goals reviewed with patient? Yes  SHORT TERM GOALS: Target date: 05/18/22  Ind with toileting and moisturizing techniques Baseline: Goal status: MET  2.  Ind with initial HEP for stretching and bulging pelvic floor Baseline:  Goal  status: MET    LONG TERM GOALS: Target date: 07/13/2022   Pt will be able to hold pelvic floor contraction for 10 seconds or more without breath holding Baseline:  Goal status: IN PROGRESS  2.  Pt will be independent with advanced HEP to maintain improvements made throughout therapy  Baseline:  Goal status: IN PROGRESS  3.  Pt will report she can make it at least  2.5 between voids Baseline: goes at least 2 hour Goal status: IN PROGRESS  4.  Pt will be able to empty bladder one time without pushing during her morning routine Baseline: 2 x in the morning and sometimes pushing Goal status: IN PROGRESS   PLAN:  PT FREQUENCY: 1x/week  PT DURATION: 12 weeks  PLANNED INTERVENTIONS: Therapeutic exercises, Therapeutic activity, Neuromuscular re-education, Balance training, Gait training, Patient/Family education, Self Care, Joint mobilization, Dry Needling, Electrical stimulation, Cryotherapy, Moist heat, Taping, Biofeedback, Manual therapy, and Re-evaluation  PLAN FOR NEXT SESSION: cont breathing and bulging and f/u on manual stretching with moisturizers, thoracic mobility with movement and lumbar STM as needed   [employer], PT 06/05/2022, 4:59 PM

## 2022-06-12 ENCOUNTER — Ambulatory Visit: Payer: BC Managed Care – PPO | Admitting: Physical Therapy

## 2022-06-12 DIAGNOSIS — R279 Unspecified lack of coordination: Secondary | ICD-10-CM | POA: Diagnosis not present

## 2022-06-12 DIAGNOSIS — M62838 Other muscle spasm: Secondary | ICD-10-CM

## 2022-06-12 NOTE — Therapy (Addendum)
OUTPATIENT PHYSICAL THERAPY FEMALE PELVIC TREATMENT   Patient Name: Beth Dunn MRN: [id number] DOB:25-Apr-1965, 57 y.o., female Today's Date: 06/12/2022  END OF SESSION:  PT End of Session - 06/12/22 1233     Visit Number 4    Date for PT Re-Evaluation 07/13/22    Authorization Type BCBS    PT Start Time 1232    PT Stop Time 1300    PT Time Calculation (min) 28 min    Activity Tolerance Patient tolerated treatment well    Behavior During Therapy [facility] for tasks assessed/performed               No past medical history on file. Past Surgical History:  Procedure Laterality Date   BREAST BIOPSY Right    [place]   There are no problems to display for this patient.   PCP: Glenis Smoker, MD   REFERRING PROVIDER: Glenis Smoker, MD   REFERRING DIAG: R35.0 (ICD-10-CM) - Frequency of micturition   THERAPY DIAG:  Unspecified lack of coordination  Other muscle spasm  Rationale for Evaluation and Treatment: Rehabilitation  ONSET DATE: month ago  SUBJECTIVE:                                                                                                                                                                                           SUBJECTIVE STATEMENT: I feel better overall.  I am still pushing to pee in the morning.  I think overall better this week focusing on stretches Fluid intake: Yes: water but not a lot, mostly coffee, sometimes colas or alcohol    PAIN:  Are you having pain? No  PRECAUTIONS: None  WEIGHT BEARING RESTRICTIONS: No  FALLS:  Has patient fallen in last 6 months? No  LIVING ENVIRONMENT: Lives with: lives with their spouse Lives in: House/apartment   OCCUPATION: teaching - up and down  PLOF: Independent  PATIENT GOALS: not having to pee as often  PERTINENT HISTORY:  2 vaginal deliveries Sexual abuse: No  BOWEL MOVEMENT: Pain with bowel movement: No Type of bowel movement:Type (Bristol Stool Scale)  normal, Frequency normal, and Strain No Fully empty rectum: Yes:   Leakage: No   URINATION: Pain with urination: No Fully empty bladder: No Stream: Weak Urgency: No Frequency: in the morning every 1 hour; nocturia 1 sometimes Leakage: Coughing and when bladder is full Pads: No  INTERCOURSE: Pain with intercourse: Initial Penetration, During Penetration, and After Intercourse Ability to have vaginal penetration:  No haven't in a while  Marinoff Scale: 3/3  PREGNANCY: Vaginal deliveries 2 Tearing Yes: some with the first   PROLAPSE:  OBJECTIVE:   DIAGNOSTIC FINDINGS:    PATIENT SURVEYS:    PFIQ-7   COGNITION: Overall cognitive status: Within functional limits for tasks assessed     SENSATION: Light touch: Appears intact Proprioception: Appears intact  MUSCLE LENGTH: Hamstrings: Right 90% deg; Left 90% deg Thomas test:  LUMBAR SPECIAL TESTS:    FUNCTIONAL TESTS:    GAIT:  Comments: WFL   POSTURE: anterior pelvic tilt  PELVIC ALIGNMENT:  LUMBARAROM/PROM:  A/PROM A/PROM  eval  Flexion   Extension   Right lateral flexion   Left lateral flexion   Right rotation   Left rotation    (Blank rows = not tested)  LOWER EXTREMITY ROM:  Passive ROM Right eval Left eval  Hip flexion    Hip extension    Hip abduction    Hip adduction    Hip internal rotation    Hip external rotation    Knee flexion    Knee extension    Ankle dorsiflexion    Ankle plantarflexion    Ankle inversion    Ankle eversion     (Blank rows = not tested)  LOWER EXTREMITY MMT: MMT 0-5 scale MMT Right eval Left eval  Hip flexion 5 5  Hip extension 4/5 4/5  Hip abduction 5 5  Hip adduction 5 5  Hip internal rotation 5 5  Hip external rotation 5 5  Knee flexion    Knee extension    Ankle dorsiflexion    Ankle plantarflexion    Ankle inversion    Ankle eversion     PALPATION:   General  tight lumbar and thoracic paraspinals                External  Perineal Exam normal appearance, dryness                             Internal Pelvic Floor tender to palpation of obturator Rt>Lt and very tight levators Rt side, but high tone bil, hard time relaxing after tightening the pelvic floor and holds breath while doing a kegel  Patient confirms identification and approves PT to assess internal pelvic floor and treatment Yes  PELVIC MMT:   MMT eval  Vaginal 3+/5 for 4 reps, 10 sec hold   Internal Anal Sphincter   External Anal Sphincter   Puborectalis   Diastasis Recti   (Blank rows = not tested)        TONE: High tone Rt>Lt  PROLAPSE: No   TODAY'S TREATMENT:                                                                                                                              DATE: 06/12/22              Manual: Bladder fascial release Patient confirms identification and approves physical therapist to perform internal soft tissue work   Bil levators and urethra compressor  muscles - fascial release around vaginal walls   PATIENT EDUCATION:  Education details: urge, toileting, moisturizing handouts Access Code: [email or web address] Person educated: Patient Education method: Explanation, Demonstration, Tactile cues, Verbal cues, and Handouts Education comprehension: verbalized understanding and returned demonstration  HOME EXERCISE PROGRAM: Access Code: [email or web address] URL: [email or web address] Date: 06/05/2022 Prepared by: Jari Favre  Exercises - Ball squeeze with Kegel  - 3 x daily - 7 x weekly - 1 sets - 10 reps - 3 sec hold - Hooklying Small March  - 1 x daily - 7 x weekly - 2 sets - 10 reps - Supine Double Knee to Chest  - 1 x daily - 7 x weekly - 1 sets - 3 reps - 30 sec hold - Happy Baby with Pelvic Floor Lengthening  - 1 x daily - 7 x weekly - 1 sets - 3 reps - 30 hold - Supine Butterfly Groin Stretch  - 1 x daily - 7 x weekly - 1 sets - 3 reps - 30 sec hold - Sidelying Thoracic Lumbar Rotation  - 1 x daily -  7 x weekly - 1 sets - 5 reps - 10 sec hold - Sit to Stand with Pelvic Floor Contraction  - 3 x daily - 7 x weekly - 1 sets - 10 reps - Standing with Forearms Thoracic Rotation  - 1 x daily - 7 x weekly - 3 sets - 10 reps - Thoracic Extension with Noodle/Towel  - 1 x daily - 7 x weekly - 3 sets - 10 reps - Quadruped Thoracic Spine Extension  - 1 x daily - 7 x weekly - 3 sets - 10 reps  ASSESSMENT:  CLINICAL IMPRESSION: Pt has a lot of tension and still difficulty relaxing after contracting the pelvic floor.  Todays session focused on soft tissue release and educated on levator stretching manually at home.  Pt is still recommended to do stretching and breathing.  Pt is expected to notice less pushing to pee after today's treatment OBJECTIVE IMPAIRMENTS: decreased coordination, decreased endurance, decreased ROM, decreased strength, increased muscle spasms, impaired flexibility, and impaired tone.   ACTIVITY LIMITATIONS: continence and toileting  PARTICIPATION LIMITATIONS: interpersonal relationship and community activity  PERSONAL FACTORS: 1 comorbidity: vaginal deliveries  are also affecting patient's functional outcome.   REHAB POTENTIAL: Excellent  CLINICAL DECISION MAKING: Stable/uncomplicated  EVALUATION COMPLEXITY: Low   GOALS: Goals reviewed with patient? Yes  SHORT TERM GOALS: Target date: 05/18/22  Ind with toileting and moisturizing techniques Baseline: Goal status: MET  2.  Ind with initial HEP for stretching and bulging pelvic floor Baseline:  Goal status: MET    LONG TERM GOALS: Target date: 07/13/2022  Updated 06/12/22  Pt will be able to hold pelvic floor contraction for 10 seconds or more without breath holding Baseline:  Goal status: IN PROGRESS  2.  Pt will be independent with advanced HEP to maintain improvements made throughout therapy  Baseline:  Goal status: IN PROGRESS  3.  Pt will report she can make it at least 2.5 between voids Baseline: goes  at least 2 hours sometimes Goal status: IN PROGRESS  4.  Pt will be able to empty bladder one time without pushing during her morning routine Baseline: 2 x in the morning and sometimes pushing Goal status: IN PROGRESS   PLAN:  PT FREQUENCY: 1x/week  PT DURATION: 12 weeks  PLANNED INTERVENTIONS: Therapeutic exercises, Therapeutic activity, Neuromuscular re-education, Balance training, Gait training, Patient/Family education, Self Care, Joint mobilization, Dry Needling, Electrical stimulation,  Cryotherapy, Moist heat, Taping, Biofeedback, Manual therapy, and Re-evaluation  PLAN FOR NEXT SESSION: cont breathing and bulging and f/u on manual fascial release last time; thoracic and lumbar STM and mobility, sitting on noodle or ball release   [employer], PT 06/12/2022, 1:03 PM  PHYSICAL THERAPY DISCHARGE SUMMARY  Visits from Start of Care: 4  Current functional level related to goals / functional outcomes: See above goals   Remaining deficits: See above details   Education / Equipment: HEP   Patient agrees to discharge. Patient goals were partially met. Patient is being discharged due to financial reasons.  Gustavus Bryant, PT, DPT 06/26/22 8:11 AM

## 2022-06-19 ENCOUNTER — Ambulatory Visit: Payer: BC Managed Care – PPO | Admitting: Physical Therapy

## 2022-06-26 ENCOUNTER — Encounter: Payer: BC Managed Care – PPO | Admitting: Physical Therapy

## 2022-07-06 ENCOUNTER — Encounter: Payer: BC Managed Care – PPO | Admitting: Physical Therapy

## 2022-08-10 ENCOUNTER — Other Ambulatory Visit: Payer: Self-pay | Admitting: Family Medicine

## 2022-08-10 DIAGNOSIS — Z1231 Encounter for screening mammogram for malignant neoplasm of breast: Secondary | ICD-10-CM

## 2022-09-11 ENCOUNTER — Ambulatory Visit
Admission: RE | Admit: 2022-09-11 | Discharge: 2022-09-11 | Disposition: A | Discharge status: Home / Self Care | Payer: BC Managed Care – PPO | Source: Ambulatory Visit | Attending: Family Medicine | Admitting: Family Medicine

## 2022-09-11 DIAGNOSIS — Z1231 Encounter for screening mammogram for malignant neoplasm of breast: Secondary | ICD-10-CM

## 2022-09-14 ENCOUNTER — Other Ambulatory Visit: Payer: Self-pay | Admitting: Family Medicine

## 2022-09-14 DIAGNOSIS — R928 Other abnormal and inconclusive findings on diagnostic imaging of breast: Secondary | ICD-10-CM

## 2022-09-25 ENCOUNTER — Ambulatory Visit
Admission: RE | Admit: 2022-09-25 | Discharge: 2022-09-25 | Disposition: A | Discharge status: Home / Self Care | Payer: BC Managed Care – PPO | Source: Ambulatory Visit | Attending: Family Medicine | Admitting: Family Medicine

## 2022-09-25 ENCOUNTER — Ambulatory Visit: Admission: RE | Admit: 2022-09-25 | Payer: BC Managed Care – PPO | Source: Ambulatory Visit

## 2022-09-25 DIAGNOSIS — R928 Other abnormal and inconclusive findings on diagnostic imaging of breast: Secondary | ICD-10-CM

## 2022-10-04 IMAGING — MG MM DIGITAL SCREENING BILAT W/ TOMO AND CAD
8 series · 9 of 24 positions shown · non-contrast
Comparison: Previous exam(s).

CLINICAL DATA: Screening.

EXAM:
DIGITAL SCREENING BILATERAL MAMMOGRAM WITH TOMOSYNTHESIS AND CAD
TECHNIQUE: Bilateral screening digital craniocaudal and mediolateral oblique
mammograms were obtained. Bilateral screening digital breast
tomosynthesis was performed. The images were evaluated with
computer-aided detection.

[R MLO synth-2D]
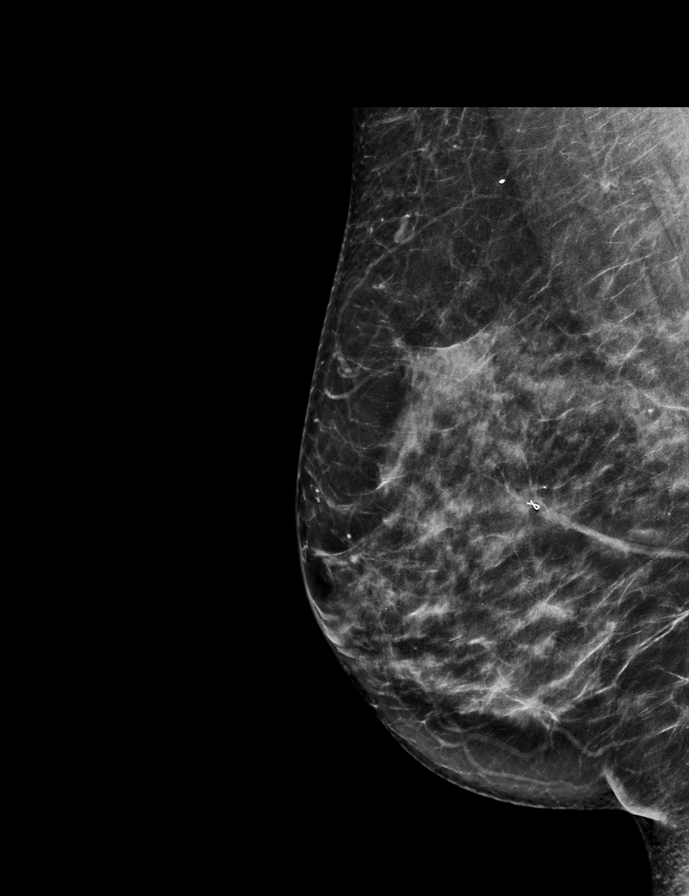

[L MLO synth-2D]
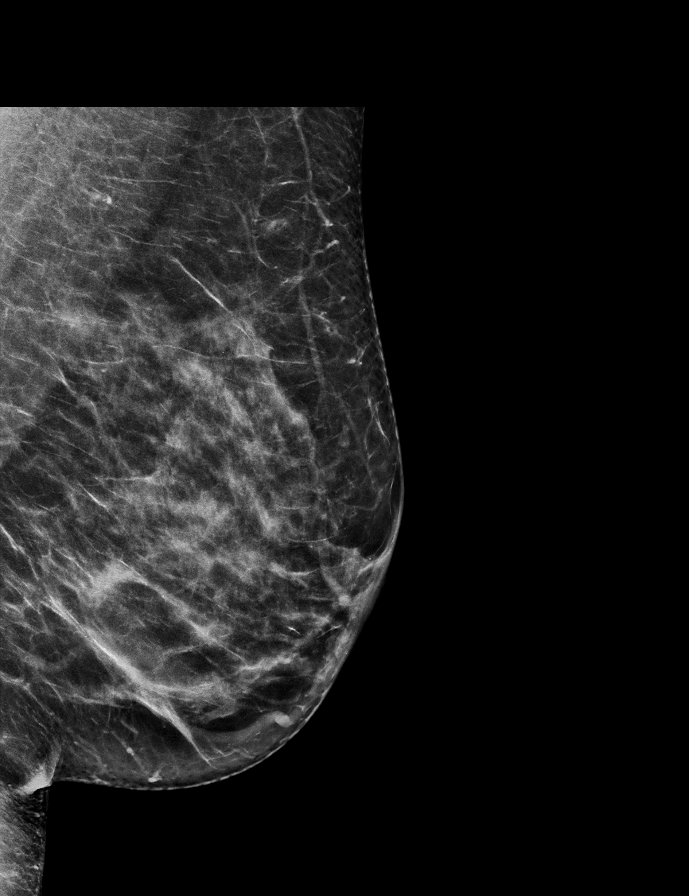

[L CC synth-2D]
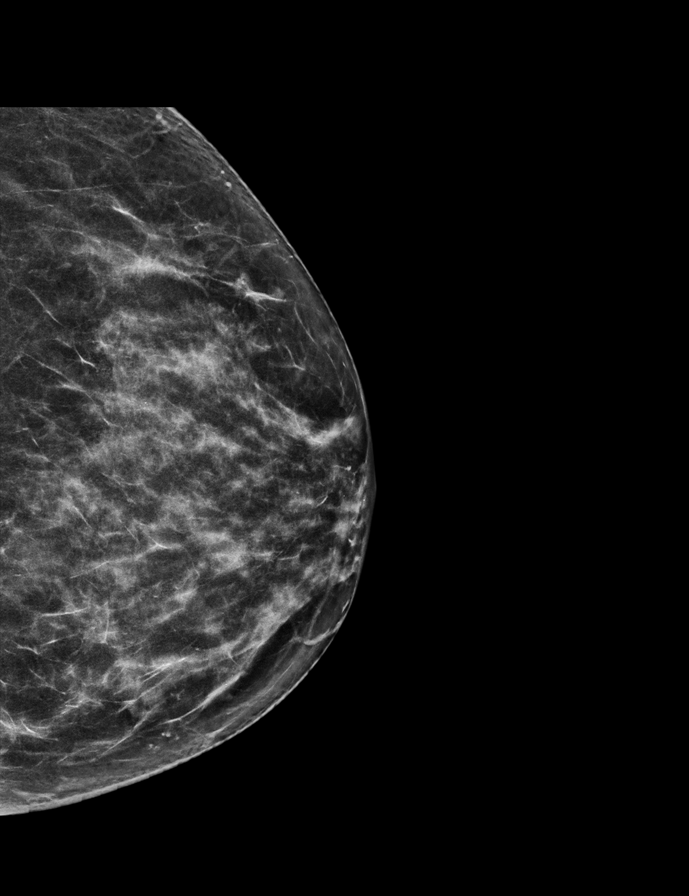

[R CC synth-2D]
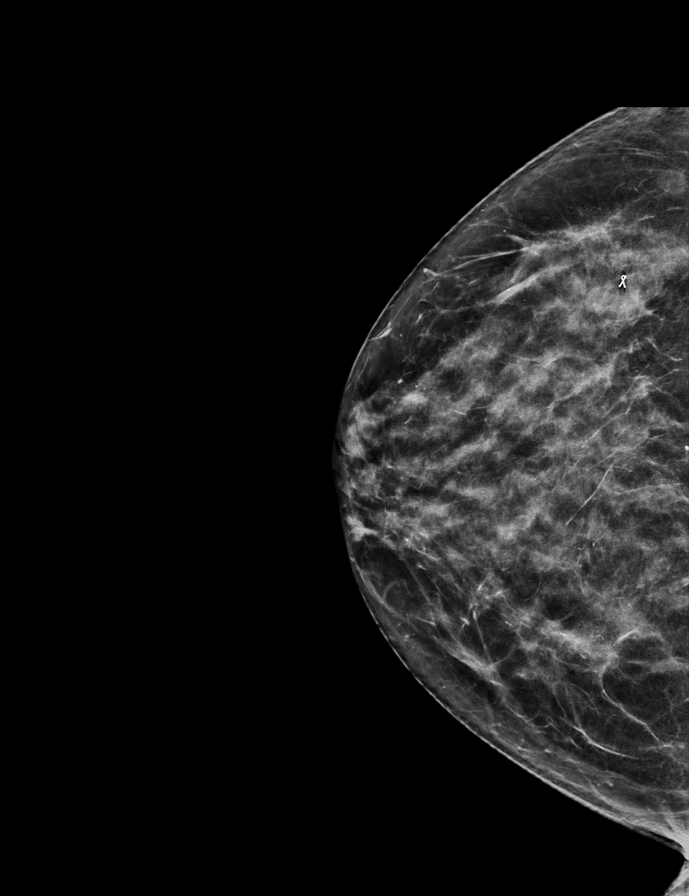

[R CC tomo · 2 of 58 frames shown]
[frame 19/58]
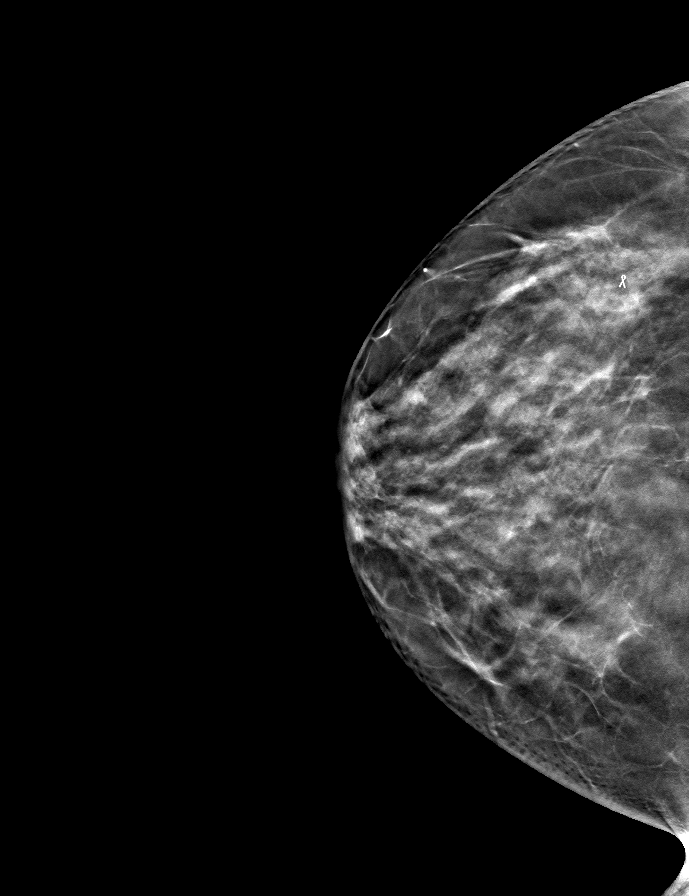
[frame 29/58]
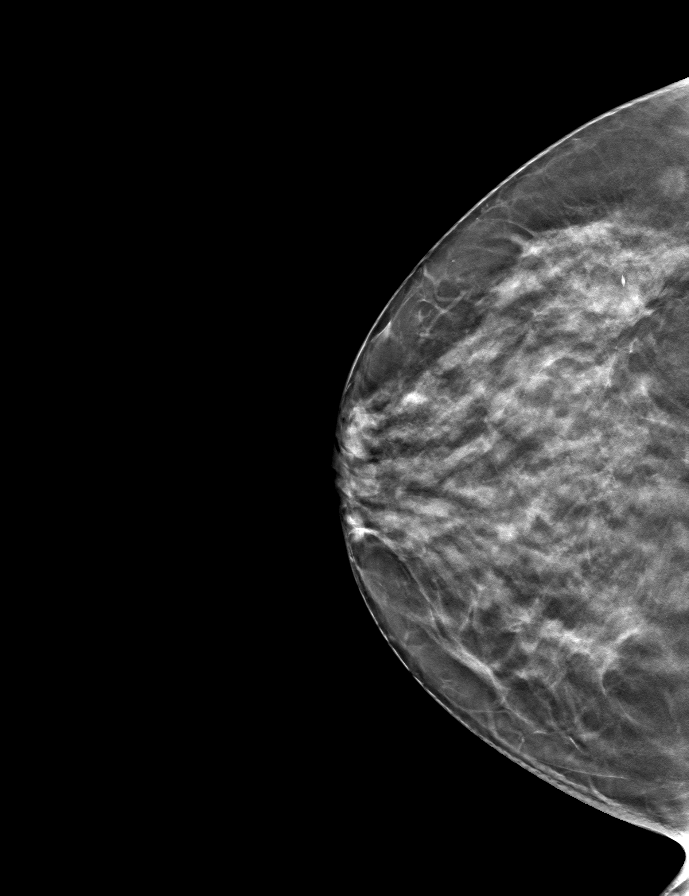

[R MLO tomo · tomo slice 31/61.0]
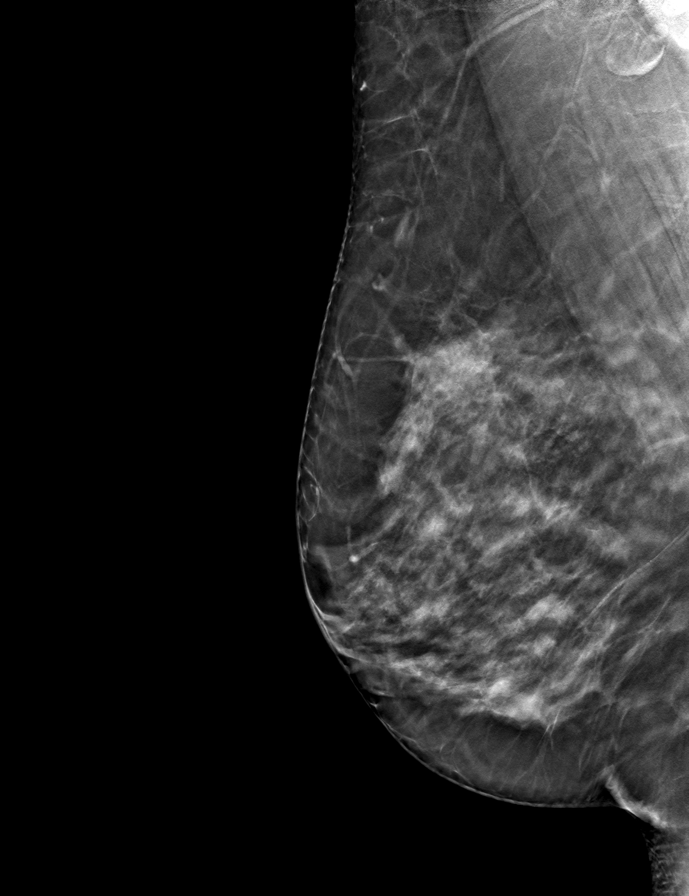

[L CC tomo · tomo slice 31/60.0]
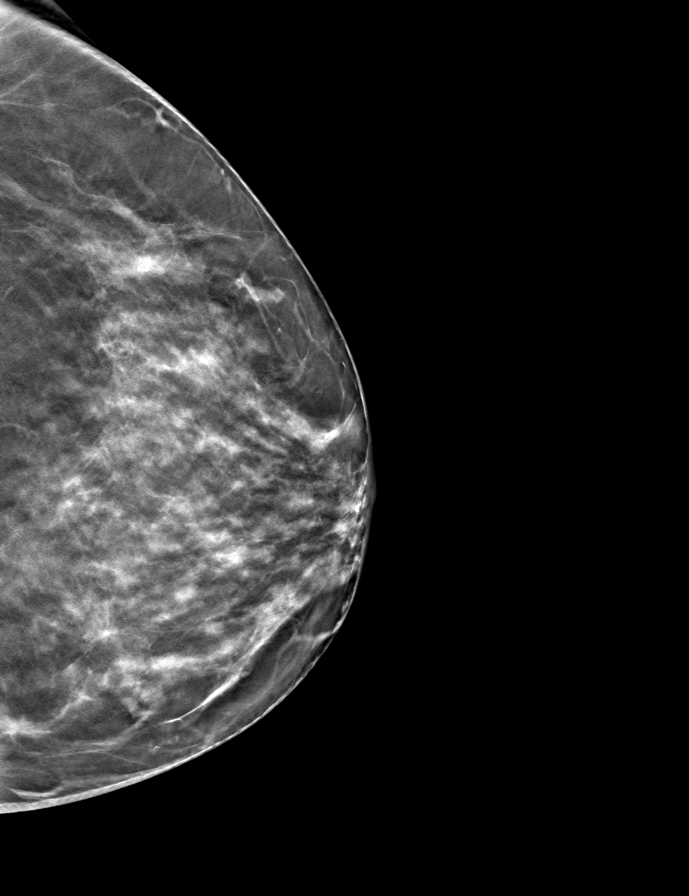

[L MLO tomo · tomo slice 33/64.0]
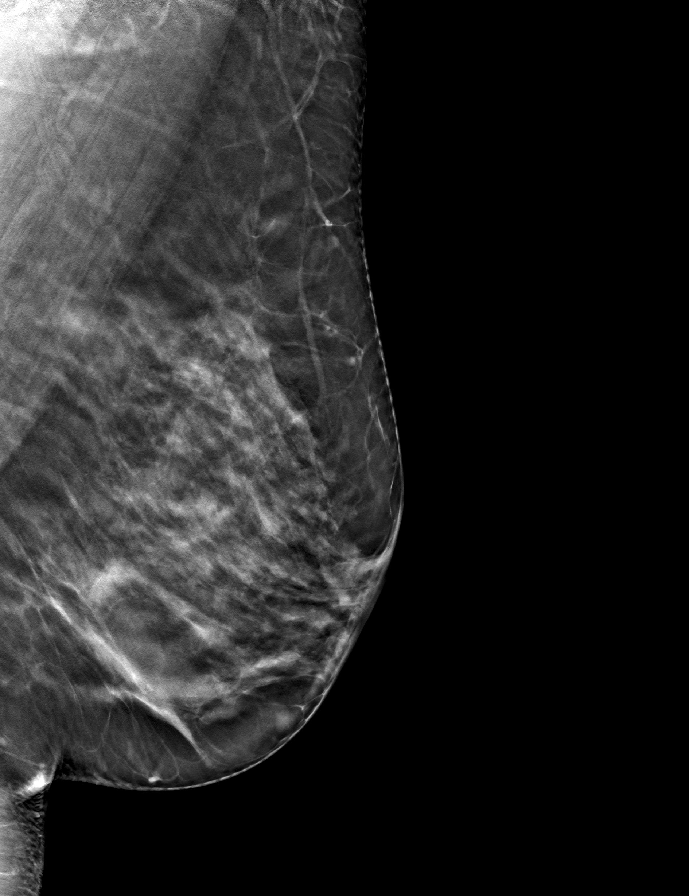

[9 of 24 positions shown; findings below may reference images not displayed]

ACR Breast Density Category d: The breast tissue is extremely dense,
which lowers the sensitivity of mammography
FINDINGS: There are no findings suspicious for malignancy.
IMPRESSION: No mammographic evidence of malignancy. A result letter of this
screening mammogram will be mailed directly to the patient.

RECOMMENDATION:
Screening mammogram in one year. (Code:TA-V-WV9)

BI-RADS CATEGORY  1: Negative.

## 2023-04-03 ENCOUNTER — Other Ambulatory Visit: Payer: Self-pay | Admitting: Family Medicine

## 2023-04-03 ENCOUNTER — Other Ambulatory Visit (HOSPITAL_COMMUNITY)
Admission: RE | Admit: 2023-04-03 | Discharge: 2023-04-03 | Disposition: A | Discharge status: Home / Self Care | Payer: BC Managed Care – PPO | Source: Ambulatory Visit | Attending: Family Medicine | Admitting: Family Medicine

## 2023-04-03 DIAGNOSIS — Z01411 Encounter for gynecological examination (general) (routine) with abnormal findings: Secondary | ICD-10-CM | POA: Insufficient documentation

## 2023-04-09 ENCOUNTER — Other Ambulatory Visit (HOSPITAL_COMMUNITY): Payer: Self-pay | Admitting: Family Medicine

## 2023-04-09 DIAGNOSIS — E785 Hyperlipidemia, unspecified: Secondary | ICD-10-CM

## 2023-04-09 LAB — CYTOLOGY - PAP
Comment: NEGATIVE
Diagnosis: NEGATIVE
High risk HPV: NEGATIVE

## 2023-04-18 ENCOUNTER — Ambulatory Visit (HOSPITAL_COMMUNITY)
Admission: RE | Admit: 2023-04-18 | Discharge: 2023-04-18 | Disposition: A | Discharge status: Home / Self Care | Payer: Self-pay | Source: Ambulatory Visit | Attending: Family Medicine | Admitting: Family Medicine

## 2023-04-18 DIAGNOSIS — E785 Hyperlipidemia, unspecified: Secondary | ICD-10-CM | POA: Insufficient documentation

## 2023-06-26 ENCOUNTER — Encounter: Payer: Self-pay | Admitting: Cardiovascular Disease

## 2023-08-17 ENCOUNTER — Other Ambulatory Visit: Payer: Self-pay | Admitting: Family Medicine

## 2023-08-17 DIAGNOSIS — Z1231 Encounter for screening mammogram for malignant neoplasm of breast: Secondary | ICD-10-CM

## 2023-09-12 ENCOUNTER — Ambulatory Visit: Payer: Self-pay

## 2023-09-13 ENCOUNTER — Ambulatory Visit
Admission: RE | Admit: 2023-09-13 | Discharge: 2023-09-13 | Disposition: A | Discharge status: Home / Self Care | Payer: Self-pay | Source: Ambulatory Visit | Attending: Family Medicine | Admitting: Family Medicine

## 2023-09-13 DIAGNOSIS — Z1231 Encounter for screening mammogram for malignant neoplasm of breast: Secondary | ICD-10-CM

## 2023-11-04 NOTE — Progress Notes (Unsigned)
 Cardiology Office Note:  .   Date:  11/05/2023  ID:  Beth Dunn, DOB 1965/10/07, MRN [id number] PCP: Chrystal Lamarr RAMAN, MD  [facility] Providers Cardiologist:  None { History of Present Illness: .    Chief Complaint  Patient presents with   coronary calcium    Beth Dunn is a 58 y.o. female with history of CAD who presents for the evaluation of SOB at the request of Chrystal Lamarr RAMAN, MD.  History of Present Illness   Beth Dunn is a 58 year old female who presents for evaluation of coronary artery disease.  She is concerned about her coronary calcium score of 68.8, which places her in the 88th percentile. She has no history of hypertension, diabetes, myocardial infarction, or cerebrovascular accident. She is currently taking 10 mg of rosuvastatin for cholesterol management and aspirin.  Her family history is significant for cardiovascular disease. Her father died at 73 from heart failure and had multiple strokes. Her paternal grandmother also had heart failure. Her maternal grandfather experienced a myocardial infarction and significant strokes, resulting in aphasia. She has two younger brothers, one of whom is overweight, but neither has undergone a calcium score test.  She denies smoking and reports occasional alcohol consumption. 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 She is married with two biological children and has two grandchildren, with another on the way.  She has been less active recently due to her husband's health issues but is attempting to resume her exercise routine. She experiences occasional palpitations occurring about once a month and lasting around 15 seconds. She notes occasional dizziness when these episodes last longer.  Currently, she is not experiencing any chest pain, dyspnea, or discomfort. She  considers her palpitations minor and not concerning enough to seek immediate medical attention.          Problem List CAD -CAC 68.8 (88th percentile)  2. HLD    ROS: All other ROS reviewed and negative. Pertinent positives noted in the HPI.     Studies Reviewed: SABRA   EKG Interpretation Date/Time:  Monday November 05 2023 13:23:11 EDT Ventricular Rate:  65 PR Interval:  164 QRS Duration:  80 QT Interval:  400 QTC Calculation: 416 R Axis:   70  Text Interpretation: Normal sinus rhythm Normal ECG Confirmed by Barbaraann Kotyk [phone]) on 11/05/2023 1:44:55 PM   Physical Exam:   VS:  BP 113/69   Pulse 63   Ht 5' 7 (1.702 m)   Wt 155 lb 11.2 oz (70.6 kg)   LMP 10/17/2013   SpO2 99%   BMI 24.39 kg/m    Wt Readings from Last 3 Encounters:  11/05/23 155 lb 11.2 oz (70.6 kg)    GEN: Well nourished, well developed in no acute distress NECK: No JVD; No carotid bruits CARDIAC: RRR, no murmurs, rubs, gallops RESPIRATORY:  Clear to auscultation without rales, wheezing or rhonchi  ABDOMEN: Soft, non-tender, non-distended EXTREMITIES:  No edema; No deformity  ASSESSMENT AND PLAN: .   Assessment and Plan    Coronary artery calcification score of 68.8, 88th percentile. Score not high enough for aspirin therapy. No symptoms or significant blockage indicated. Further testing unnecessary unless symptoms develop. - Discontinue aspirin. - Continue rosuvastatin. LDL at goal.  - Encourage lifestyle modifications: regular exercise, balanced diet, stress management. - Discuss calcium scoring benefits with family.  Palpitations  and intermittent dizziness Infrequent, short-lived palpitations with occasional dizziness. Likely benign but requires monitoring for changes. - Consider Kardia Mobile device for monitoring. - Maintain hydration, regular exercise, stress management. - Monitor for increased frequency or severity of symptoms.  Hyperlipidemia Well-managed with rosuvastatin, cholesterol  levels at goal. - Continue rosuvastatin. - Maintain heart-healthy lifestyle: balanced diet, regular physical activity.              Follow-up: Return if symptoms worsen or fail to improve.   Signed, Darryle DASEN. Barbaraann, MD, [facility]  [facility]  [address] [place], [place] [address] [phone]  3:27 PM

## 2023-11-05 ENCOUNTER — Encounter: Payer: Self-pay | Admitting: Cardiovascular Disease

## 2023-11-05 ENCOUNTER — Ambulatory Visit: Payer: Self-pay | Attending: Cardiovascular Disease | Admitting: Cardiovascular Disease

## 2023-11-05 VITALS — BP 113/69 | HR 63 | Ht 67.0 in | Wt 155.7 lb

## 2023-11-05 DIAGNOSIS — R931 Abnormal findings on diagnostic imaging of heart and coronary circulation: Secondary | ICD-10-CM

## 2023-11-05 DIAGNOSIS — E782 Mixed hyperlipidemia: Secondary | ICD-10-CM

## 2023-11-05 DIAGNOSIS — R0602 Shortness of breath: Secondary | ICD-10-CM

## 2023-11-05 DIAGNOSIS — Z8249 Family history of ischemic heart disease and other diseases of the circulatory system: Secondary | ICD-10-CM

## 2023-11-05 NOTE — Patient Instructions (Signed)
   Follow-Up: At [facility], you and your health needs are our priority.  As part of our continuing mission to provide you with exceptional heart care, our providers are all part of one team.  This team includes your primary Cardiologist (physician) and Advanced Practice Providers or APPs (Physician Assistants and Nurse Practitioners) who all work together to provide you with the care you need, when you need it.  Your next appointment:   AS NEEDED
